# Patient Record
Sex: Female | Born: 1965 | Race: White | Hispanic: No | Smoking: Never smoker
Health system: Southern US, Community
[De-identification: ages and names within clinical notes are randomized; demographics above are authoritative.]

## PROBLEM LIST (undated history)

## (undated) DIAGNOSIS — I10 Essential (primary) hypertension: Secondary | ICD-10-CM

---

## 2013-09-02 ENCOUNTER — Emergency Department (HOSPITAL_COMMUNITY)
Admission: EM | Admit: 2013-09-02 | Discharge: 2013-09-02 | Disposition: A | Discharge status: Home / Self Care | Payer: Self-pay | Attending: Emergency Medicine | Admitting: Emergency Medicine

## 2013-09-02 ENCOUNTER — Encounter (HOSPITAL_COMMUNITY): Payer: Self-pay | Admitting: Emergency Medicine

## 2013-09-02 ENCOUNTER — Emergency Department (HOSPITAL_COMMUNITY): Payer: Self-pay

## 2013-09-02 DIAGNOSIS — Y9389 Activity, other specified: Secondary | ICD-10-CM | POA: Insufficient documentation

## 2013-09-02 DIAGNOSIS — Z79899 Other long term (current) drug therapy: Secondary | ICD-10-CM | POA: Insufficient documentation

## 2013-09-02 DIAGNOSIS — S92309A Fracture of unspecified metatarsal bone(s), unspecified foot, initial encounter for closed fracture: Secondary | ICD-10-CM | POA: Insufficient documentation

## 2013-09-02 DIAGNOSIS — R296 Repeated falls: Secondary | ICD-10-CM | POA: Insufficient documentation

## 2013-09-02 DIAGNOSIS — Y929 Unspecified place or not applicable: Secondary | ICD-10-CM | POA: Insufficient documentation

## 2013-09-02 DIAGNOSIS — S92355A Nondisplaced fracture of fifth metatarsal bone, left foot, initial encounter for closed fracture: Secondary | ICD-10-CM

## 2013-09-02 DIAGNOSIS — I10 Essential (primary) hypertension: Secondary | ICD-10-CM | POA: Insufficient documentation

## 2013-09-02 HISTORY — DX: Essential (primary) hypertension: I10

## 2013-09-02 MED ORDER — HYDROCODONE-ACETAMINOPHEN 5-325 MG PO TABS: 1.0000 | ORAL_TABLET | Freq: Four times a day (QID) | ORAL | Status: AC | PRN

## 2013-09-02 NOTE — Discharge Instructions (Signed)
Please call your doctor for a followup appointment within 24-48 hours. When you talk to your doctor please let them know that you were seen in the emergency department and have them acquire all of your records so that they can discuss the findings with you and formulate a treatment plan to fully care for your new and ongoing problems. Please call and set-up an appointment with Dr. Victorino DikeHewitt, orthopedics, to be re-assessed within 2 weeks Please rest, ice, elevate - toes above nose Please use crutches - do not bear weight onto the left foot Please take medications as prescribed - while on pain medications please do not drink alcohol, operate any heavy machinery, or drive while on pain the medications. Please do not take any extra Tylenol for this can lead to Tylenol overdose and liver issues.  Please continue to monitor symptoms closely and if symptoms are to worsen or change (fever greater than 101, chills, sweating, nausea, vomiting, chest pain, shortness of breath, difficulty breathing, numbness, tingling, loss of sensation, fall, injury, worsening or changes to pain patter) please report back to the ED immediately   Metatarsal Fracture  with Rehab A metatarsal fracture is a break (fracture) of one of the bones of the mid-foot (metatarsal bones). The metatarsal bones are responsible for maintaining the arch of the foot. There are three classifications of metatarsal fractures: dancer's fractures, Jones fractures, and stress fractures. A dancer's fracture is when a piece of bone is pulled off by a ligament or tendon (avulsion fracture) of the outer part of the foot (fifth metatarsal), near the joint with the ankle bones. A Jones fracture occurs in the middle of the fifth metatarsal. These fractures have limited ability to heal. A stress fracture occurs when the bone is slowly injured faster than it can repair itself. SYMPTOMS   Sharp pain, especially with standing or walking.  Tenderness, swelling, and  later bruising (contusion) of the foot.  Numbness or paralysis from swelling in the foot, causing pressure on the blood vessels or nerves (uncommon). CAUSES  Fractures occur when a force is placed on the bone that is greater than it can handle. Common causes of injury include:  Direct hit (trauma) to the foot.  Twisting injury to the foot or ankle.  Landing on the foot and ankle in an improper position. RISK INCRESES WITH:  Participation in contact sports, sports that require jumping and landing, or sports in which cleats are worn and sliding occurs.  Previous foot or ankle sprains or dislocations.  Repeated injury to any joint in the foot.  Poor strength and flexibility. PREVENTION  Warm up and stretch properly before an activity.  Allow for adequate recovery between workouts.  Maintain physical fitness in:  Strength, flexibility, and endurance.  Cardiovascular fitness.  When participating in jumping or contact sports, protect joints with supportive devices, such as wrapped elastic bandages, tape, braces, or high-top athletic shoes.  Wear properly fitted and padded protective equipment. PROGNOSIS If treated properly, metatarsal fractures usually heal well. Jones fractures have a higher risk of the bone failing to heal (nonunion). Sometimes, surgery is needed to heal Jones fractures.  RELATED COMPLICATIONS   Nonunion.  Fracture heals in a poor position (malunion).  Long-term (chronic) pain, stiffness, or swelling of the foot.  Excessive bleeding in the foot or at the dislocation site, causing pressure and injury to nerves and blood vessels (rare).  Unstable or arthritic joint following repeated injury or delayed treatment. TREATMENT  Treatment first involves the use of ice  and medicine, to reduce pain and inflammation. If the bone fragments are out of alignment (displaced), then immediate realigning of the bones (reduction) is required. Fractures that cannot be  realigned by hand, or where the bones protrude through the skin (open), may require surgery to hold the fracture in place with screws, pins, and plates. After the bones are in proper alignment, the foot and ankle must be restrained for 6 or more weeks. Restraint allows healing to occur. After restraint, it is important to perform strengthening and stretching exercises to help regain strength and a full range of motion. These exercises may be completed at home or with a therapist. A stiff-soled shoe and arch support (orthotic) may be required when first returning to sports. MEDICATION   If pain medicine is needed, nonsteroidal anti-inflammatory medicines (NSAIDS), or other minor pain relievers, are often advised.  Do not take pain medicine for 7 days before surgery.  Only take over-the-counter or prescription medicines for pain, fever, or discomfort as directed by your caregiver. COLD THERAPY  Cold treatment (icing) should be applied for 10 to 15 minutes every 2 to 3 hours for inflammations and pain, and immediately after activity that aggravates your symptoms. Use ice packs or ice massage. SEEK MEDICAL CARE IF:  Pain, tenderness, or swelling gets worse, despite treatment.  You experience pain, numbness, or coldness in the foot.  Blue, gray, or dark color appears in the toenails.  You or your child has an oral temperature above 102 F (38.9 C).  You have increased pain, swelling, and redness.  You have drainage of fluids or bleeding in the affected area.  New, unexplained symptoms develop. (Drugs used in treatment may produce side effects.) EXERCISES RANGE OF MOTION (ROM) AND STRETCHING EXERCISES - Metatarsal Fracture (including Jones and Dancer's Fractures) These exercises may help you when beginning to rehabilitate your injury. Your symptoms may resolve with or without further involvement from your physician, physical therapist, or athletic trainer. While completing these exercises,  remember:   Restoring tissue flexibility helps normal motion to return to the joints. This allows healthier, less painful movement and activity.  An effective stretch should be held for at least 30 seconds. A stretch should never be painful. You should only feel a gentle lengthening or release in the stretched. RANGE OF MOTION - Dorsi/Plantar Flexion  While sitting with your right / left knee straight, draw the top of your foot upwards, by flexing your ankle. Then reverse the motion, pointing your toes downward.  Hold each position for __________ seconds.  After completing your first set of exercises, repeat this exercise with your knee bent. Repeat __________ times. Complete this exercise __________ times per day.  RANGE OF MOTION - Ankle Alphabet  Imagine your right / left big toe is a pen.  Keeping your hip and knee still, write out the entire alphabet with your "pen." Make the letters as large as you can, without increasing any discomfort. Repeat __________ times. Complete this exercise __________ times per day.  STRETCH - Gastroc, Standing  Place your hands on a wall.  Extend your right / left leg behind you, keeping the front knee somewhat bent.  Slightly point your toes inward on your back foot.  Keeping your right / left heel on the floor and your knee straight, shift your weight toward the wall, not allowing your back to arch.  You should feel a gentle stretch in the right / left calf. Hold this position for __________ seconds. Repeat __________ times.  Complete this stretch __________ times per day. STRETCH - Soleus, Standing   Place your hands on a wall.  Extend your right / left leg behind you, keeping the other knee somewhat bent.  Slightly point your toes inward on your back foot.  Keep your right / left heel on the floor, bend your back knee, and slightly shift your weight over the back leg so that you feel a gentle stretch deep in your back calf.  Hold this  position for __________ seconds. Repeat __________ times. Complete this stretch __________ times per day. STRENGTHENING EXERCISES - Metatarsal Fracture (Including Jones and Dancer's Fractures) These exercises may help you when beginning to rehabilitate your injury. They may resolve your symptoms with or without further involvement from your physician, physical therapist, or athletic trainer. While completing these exercises, remember:   Muscles can gain both the endurance and the strength needed for everyday activities through controlled exercises.  Complete these exercises as instructed by your physician, physical therapist or athletic trainer. Increase the resistance and repetitions only as guided by your caregiver. STRENGTH - Dorsiflexors  Secure a rubber exercise band or tubing to a fixed object (table, pole) and loop the other end around your right / left foot.  Sit on the floor facing the fixed object. The band should be slightly tense when your foot is relaxed.  Slowly draw your foot back toward you, using your ankle and toes.  Hold this position for __________ seconds. Slowly release the tension in the band and return your foot to the starting position. Repeat __________ times. Complete this exercise __________ times per day.  STRENGTH - Plantar-flexors   Sit with your right / left leg extended. Holding onto both ends of a rubber exercise band or tubing, loop it around the ball of your foot. Keep a slight tension in the band.  Slowly push your toes away from you, pointing them downward.  Hold this position for __________ seconds. Return slowly, controlling the tension in the band. Repeat __________ times. Complete this exercise __________ times per day.  STRENGTH - Plantar-flexors  Stand with your feet shoulder width apart. Steady yourself with a wall or table, using as little support as needed.  Keeping your weight evenly spread over the width of your feet, rise up on your  toes.*  Hold this position for __________ seconds. Repeat __________ times. Complete this exercise __________ times per day.  *If this is too easy, shift your weight toward your right / left leg until you feel challenged. Ultimately, you may be asked to do this exercise while standing on your right / left foot only. STRENGTH - Towel Curls  Sit in a chair, on a non-carpeted surface.  Place your foot on a towel, keeping your heel on the floor.  Pull the towel toward your heel only by curling your toes. Keep your heel on the floor.  If instructed by your physician, physical therapist, or athletic trainer, weight may be added at the end of the towel. Repeat __________ times. Complete this exercise __________ times per day. STRENGTH - Ankle Eversion  Secure one end of a rubber exercise band or tubing to a fixed object (table, pole). Loop the other end around your foot, just before your toes.  Place your fists between your knees. This will focus your strengthening at your ankle.  Drawing the band across your opposite foot, away from the pole, slowly, pull your little toe out and up. Make sure the band is positioned to resist  the entire motion.  Hold this position for __________ seconds.  Have your muscles resist the band, as it slowly pulls your foot back to the starting position. Repeat __________ times. Complete this exercise __________ times per day.  STRENGTH - Ankle Inversion  Secure one end of a rubber exercise band or tubing to a fixed object (table, pole). Loop the other end around your foot, just before your toes.  Place your fists between your knees. This will focus your strengthening at your ankle.  Slowly, pull your big toe up and in, making sure the band is positioned to resist the entire motion.  Hold this position for __________ seconds.  Have your muscles resist the band, as it slowly pulls your foot back to the starting position. Repeat __________ times. Complete this  exercises __________ times per day.  Document Released: 02/17/2005 Document Revised: 05/12/2011 Document Reviewed: 06/01/2008 [facility] Patient Information 2015 [place], [place]. This information is not intended to replace advice given to you by your health care provider. Make sure you discuss any questions you have with your health care provider.

## 2013-09-02 NOTE — ED Notes (Signed)
The pt has had lt foot pain for the past 3 weeks   She has been using a tread climber and tonight she fell over her cat causing more pain.  lmp 2 weeks agp

## 2013-09-02 NOTE — ED Notes (Signed)
Applied ice pack to left foot.

## 2013-09-02 NOTE — ED Provider Notes (Signed)
CSN: [id number]     Arrival date & time 09/02/13  1922 History  This chart was scribed for Raymon MuttonMarissa Nichola Warren, PA-C, working with Audree CamelScott T Goldston, MD by Leona CarryG. Clay Sherrill, ED Scribe. The patient was seen in TR07C/TR07C. The patient's care was started at 3:54 AM.     Chief Complaint  Patient presents with  . Foot Pain    Patient is a 48 y.o. female presenting with lower extremity pain. The history is provided by the patient. No language interpreter was used.  Foot Pain   HPI Comments: Renaldo FiddlerSarah Theresa Kington is a 48 y.o. female who presents to the Emergency Department complaining of severe left lateral foot pain beginning approximately 4 hours ago when she jumped to avoid stepping on her cat and landed awkwardly on her left foot. Patient reports that she has had pain in her left foot for the past 4 weeks. She states that she has continued to exercise and originally believed the pain to be a result of repetitive use of her Stair Climber machine. Patient reports that she has not seen an orthopedist for the pain. Patient denies loss of sensation or numbness in her left foot.      Past Medical History  Diagnosis Date  . Hypertension    History reviewed. No pertinent past surgical history. No family history on file. History  Substance Use Topics  . Smoking status: Never Smoker   . Smokeless tobacco: Not on file  . Alcohol Use: Yes   OB History   Grav Para Term Preterm Abortions TAB SAB Ect Mult Living                 Review of Systems  Musculoskeletal: Positive for arthralgias (Left lateral foot).  Neurological: Negative for numbness.      Allergies  Banana and Vibramycin  Home Medications   Prior to Admission medications   Medication Sig Start Date End Date Taking? Authorizing Provider  escitalopram (LEXAPRO) 20 MG tablet Take 20 mg by mouth daily.   Yes Historical Provider, MD  valsartan (DIOVAN) 80 MG tablet Take 80 mg by mouth daily.   Yes Historical Provider, MD   HYDROcodone-acetaminophen (NORCO/VICODIN) 5-325 MG per tablet Take 1 tablet by mouth every 6 (six) hours as needed for moderate pain or severe pain. 09/02/13   Kimball Manske, PA-C   Triage Vitals: BP 130/89  Pulse 120  Temp(Src) 97.7 F (36.5 C)  Resp 18  Ht 5\' 4"  (1.626 m)  Wt 182 lb (82.555 kg)  BMI 31.22 kg/m2  SpO2 98%  LMP 08/19/2013 Physical Exam  Nursing note and vitals reviewed. Constitutional: She is oriented to person, place, and time. She appears well-developed and well-nourished. No distress.  HENT:  Head: Normocephalic and atraumatic.  Eyes: Conjunctivae and EOM are normal. Right eye exhibits no discharge. Left eye exhibits no discharge.  Neck: Normal range of motion. Neck supple.  Cardiovascular: Normal rate, regular rhythm and normal heart sounds.  Exam reveals no friction rub.   No murmur heard. Pulses:      Radial pulses are 2+ on the right side, and 2+ on the left side.       Dorsalis pedis pulses are 2+ on the right side, and 2+ on the left side.       Posterior tibial pulses are 2+ on the right side, and 2+ on the left side.  Cap refill < 3 seconds  Pulmonary/Chest: Effort normal and breath sounds normal. No respiratory distress. She has no wheezes.  She has no rales.  Musculoskeletal: She exhibits tenderness.       Left ankle: She exhibits normal range of motion and no swelling. Tenderness. Head of 5th metatarsal tenderness found.       Feet:  Negative swelling, erythema, formation, lesions, sores, deformities identified to left foot. Mild beginnings of ecchymosis identified to the lateral aspect of the left foot. Discomfort upon palpation to the lateral aspect of the left foot, distal left metatarsal. Mild discomfort with range of motion to the digits of the left foot. Full range of motion to left ankle.  Neurological: She is alert and oriented to person, place, and time. No cranial nerve deficit. She exhibits normal muscle tone. Coordination normal.  Cranial  nerves III-XII grossly intact Strength 5+/5+ to lower extremities bilaterally with resistance applied, equal distribution noted Strength intact to digits of the feet bilaterally  Sensation intact with differentiation to sharp and dull touch   Skin: Skin is warm and dry. No rash noted. She is not diaphoretic. No erythema.  Psychiatric: She has a normal mood and affect. Her behavior is normal. Thought content normal.    ED Course  Procedures (including critical care time) DIAGNOSTIC STUDIES: Oxygen Saturation is 98% on room air, normal by my interpretation.    COORDINATION OF CARE: 8:21 PM-Discussed treatment plan which includes left foot and ankle x-ray and a consult with orthopedics with pt at bedside and pt agreed to plan.   8:36 PM This provider discussed case with Dr. Charlann Boxer, orthopedic surgeon. As per orthopedic surgeon recommended patient to be placed in cam walker boot with crutches. Recommended patient to perform nonweightbearing exercises. Recommended patient to followup Dr. Victorino Dike within 2 weeks.  Labs Review Labs Reviewed - No data to display  Imaging Review Dg Ankle Complete Left  09/02/2013   CLINICAL DATA:  Left lateral foot pain.  EXAM: LEFT ANKLE COMPLETE - 3+ VIEW  COMPARISON:  Foot series performed today.  FINDINGS: Transverse healing fracture noted through the proximal shaft of the left fifth metatarsal as seen on foot series. No additional acute bony abnormality. No ankle fracture, subluxation or dislocation. Spurring noted along the anterior aspect of the navicular.  IMPRESSION: Left fifth metatarsal proximal shaft fracture.   Electronically Signed   By: Charlett Nose M.D.   On: 09/02/2013 20:10   Dg Foot Complete Left  09/02/2013   CLINICAL DATA:  Lateral foot pain.  EXAM: LEFT FOOT - COMPLETE 3+ VIEW  COMPARISON:  None.  FINDINGS: There is a healing transverse fracture across the proximal shaft of the left fifth metatarsal. Fracture line is most evident on the lateral view.  Healing callus and sclerosis noted in the area. Overlying soft tissue swelling.  IMPRESSION: Healing transverse fracture across the left fifth proximal metatarsal shaft.   Electronically Signed   By: Charlett Nose M.D.   On: 09/02/2013 20:10     EKG Interpretation None      MDM   Final diagnoses:  Closed nondisplaced fracture of fifth left metatarsal bone, initial encounter    Filed Vitals:   09/02/13 1926 09/02/13 2056  BP: 130/89 137/94  Pulse: 120 110  Temp: 97.7 F (36.5 C)   Resp: 18 18  Height: 5\' 4"  (1.626 m)   Weight: 182 lb (82.555 kg)   SpO2: 98% 100%   I personally performed the services described in this documentation, which was scribed in my presence. The recorded information has been reviewed and is accurate.  Patient presented to the ED  with left foot pain does been ongoing for the past 3 weeks with worsening today. Patient reported that her cat was in front of her, stated that she jumped to get out of the Display and landed on her left foot on her tippy toes. Reported that she had excruciating sharp shooting pain to the lateral aspect of her left foot. Plain film of left foot and left ankle identified left fifth metatarsal proximal shaft fracture - Jones fracture, closed. This provider spoke with Dr. Olin, orthopedic surgeon who recommended patient be placedCharlann Boxer in cam walker boot and crutches to be administered and for patient to followup with Dr. Victorino DikeHewitt within 2 weeks. Patient neurovascularly intact. Negative focal neurological deficits noted. Full range of motion to the digits of the feet bilaterally. Pulses palpable and strong. Patient placed in cam walker boot and crutches administered. Patient stable, afebrile. Patient not septic appearing. Discharged patient. Discussed with patient to rest, ice, elevate - toes above nose. Referred patient to orthopedics, Dr. Victorino DikeHewitt to followup within 2 weeks. Discussed with patient nonweightbearing. Discussed with patient to closely  monitor symptoms and if symptoms are to worsen or change to report back to the ED - strict return instructions given.  Patient agreed to plan of care, understood, all questions answered.   [employer] CorporationMarissa Aleen Marston, PA-C 09/03/13 [phone]

## 2013-09-05 NOTE — ED Provider Notes (Signed)
Medical screening examination/treatment/procedure(s) were performed by non-physician practitioner and as supervising physician I was immediately available for consultation/collaboration.   EKG Interpretation None        Shi T Sahira Cataldi, MD 09/05/13 1204 

## 2015-02-28 IMAGING — CR DG FOOT COMPLETE 3+V*L*
3 series · 3 of 3 positions shown · non-contrast
Comparison: None.

CLINICAL DATA: Lateral foot pain.

EXAM:
LEFT FOOT - COMPLETE 3+ VIEW

[x foot ap left]
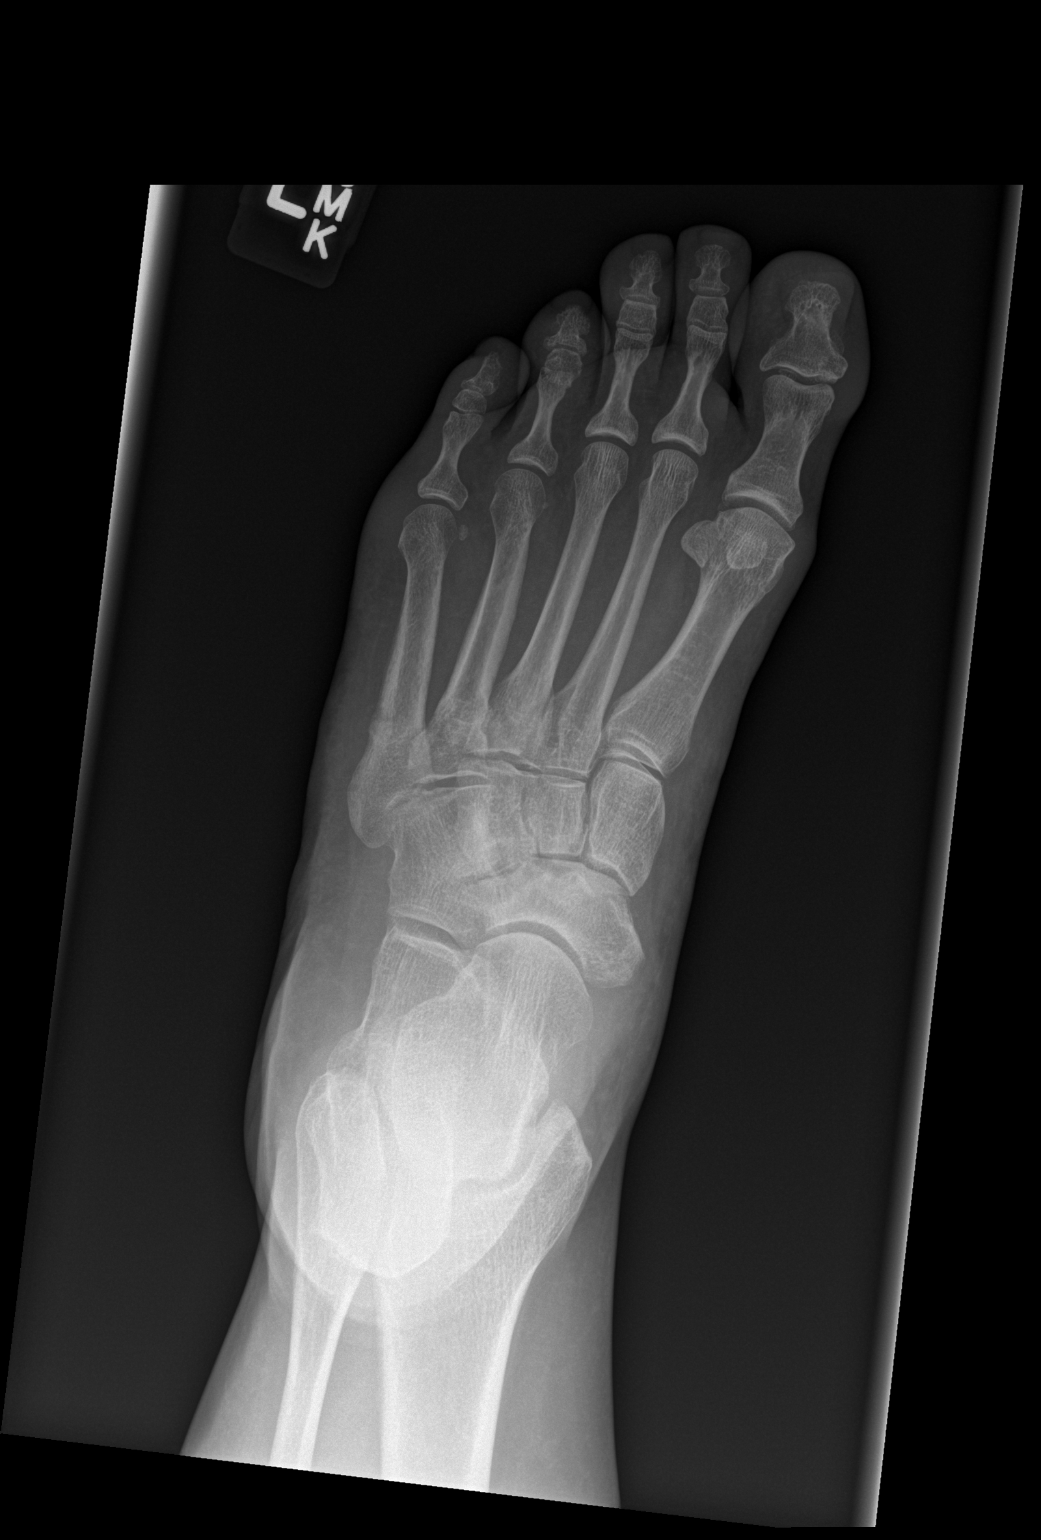

[x foot obl left]
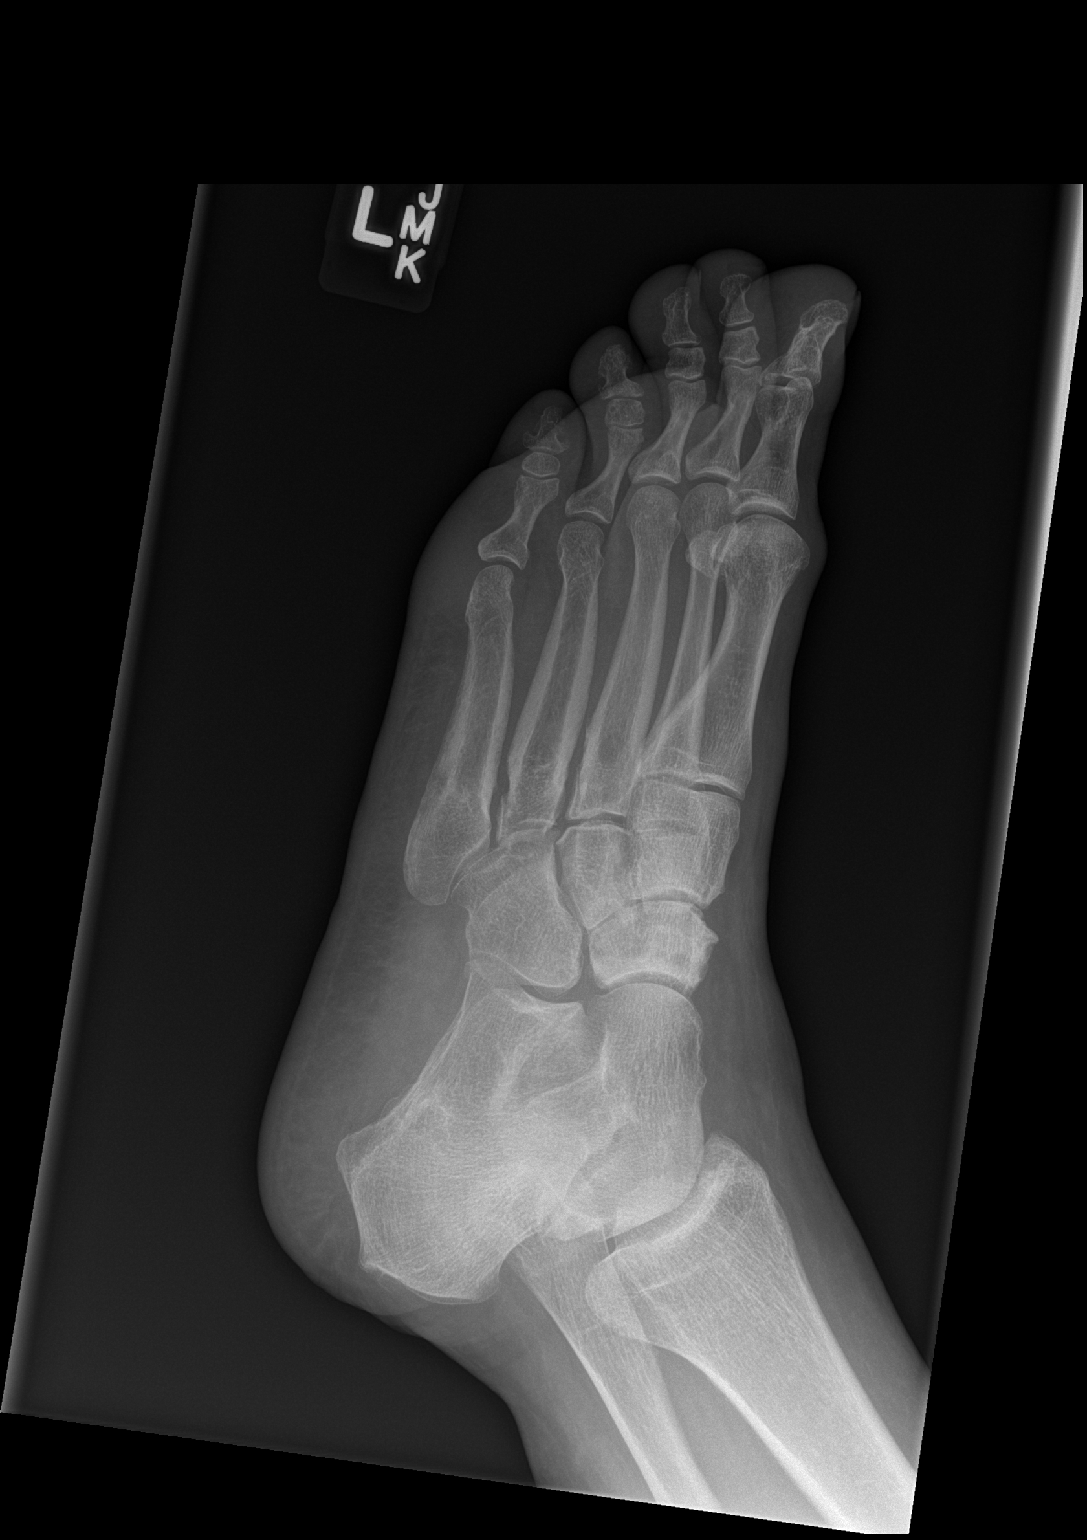

[x foot lat left]
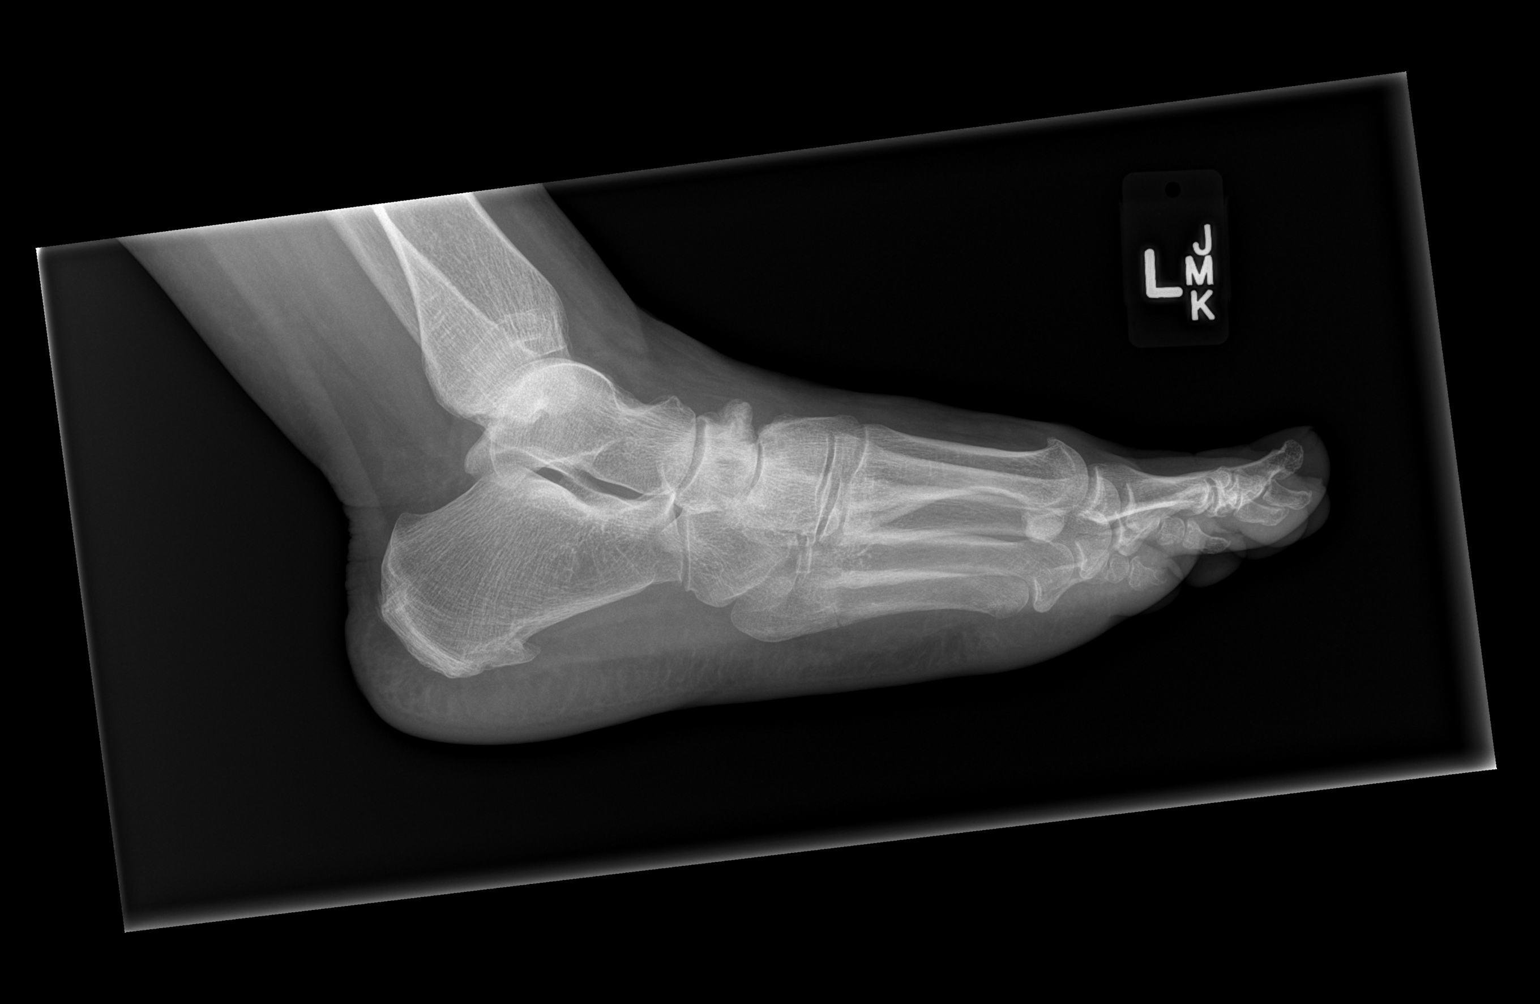

[3 of 3 positions shown; findings below may reference images not displayed]

FINDINGS: There is a healing transverse fracture across the proximal shaft of
the left fifth metatarsal. Fracture line is most evident on the
lateral view. Healing callus and sclerosis noted in the area.
Overlying soft tissue swelling.
IMPRESSION: Healing transverse fracture across the left fifth proximal
metatarsal shaft.
